# Patient Record
Sex: Female | Born: 1985 | Race: Black or African American | Hispanic: No | Marital: Single | Smoking: Never smoker
Health system: Southern US, Community
[De-identification: ages and names within clinical notes are randomized; demographics above are authoritative.]

## PROBLEM LIST (undated history)

## (undated) ENCOUNTER — Ambulatory Visit: Admission: EM | Payer: No Typology Code available for payment source

## (undated) DIAGNOSIS — B009 Herpesviral infection, unspecified: Secondary | ICD-10-CM

## (undated) DIAGNOSIS — R011 Cardiac murmur, unspecified: Secondary | ICD-10-CM

## (undated) HISTORY — DX: Cardiac murmur, unspecified: R01.1

## (undated) HISTORY — DX: Herpesviral infection, unspecified: B00.9

## (undated) HISTORY — PX: WISDOM TOOTH EXTRACTION: SHX21

---

## 2016-05-01 LAB — HM PAP SMEAR

## 2021-06-22 ENCOUNTER — Other Ambulatory Visit: Payer: Self-pay

## 2021-06-22 ENCOUNTER — Encounter: Payer: Self-pay | Admitting: Obstetrics and Gynecology

## 2021-06-22 ENCOUNTER — Ambulatory Visit (INDEPENDENT_AMBULATORY_CARE_PROVIDER_SITE_OTHER): Payer: BC Managed Care – PPO | Admitting: Obstetrics and Gynecology

## 2021-06-22 ENCOUNTER — Other Ambulatory Visit (HOSPITAL_COMMUNITY)
Admission: RE | Admit: 2021-06-22 | Discharge: 2021-06-22 | Disposition: A | Discharge status: Home / Self Care | Payer: BC Managed Care – PPO | Source: Ambulatory Visit | Attending: Obstetrics and Gynecology | Admitting: Obstetrics and Gynecology

## 2021-06-22 VITALS — BP 100/60 | Ht 67.0 in | Wt 170.0 lb

## 2021-06-22 DIAGNOSIS — Z124 Encounter for screening for malignant neoplasm of cervix: Secondary | ICD-10-CM | POA: Insufficient documentation

## 2021-06-22 DIAGNOSIS — Z01419 Encounter for gynecological examination (general) (routine) without abnormal findings: Secondary | ICD-10-CM | POA: Diagnosis not present

## 2021-06-22 DIAGNOSIS — Z1151 Encounter for screening for human papillomavirus (HPV): Secondary | ICD-10-CM | POA: Diagnosis not present

## 2021-06-22 DIAGNOSIS — Z30431 Encounter for routine checking of intrauterine contraceptive device: Secondary | ICD-10-CM | POA: Diagnosis not present

## 2021-06-22 NOTE — Patient Instructions (Signed)
I value your feedback and you entrusting us with your care. If you get a [facility] patient survey, I would appreciate you taking the time to let us know about your experience today. Thank you! ? ? ?

## 2021-06-22 NOTE — Progress Notes (Signed)
PCP:  Pcp, No   Chief Complaint  Patient presents with   Gynecologic Exam    Painful periods     HPI:      [name] is a 35 y.o. No obstetric history on file. whose LMP was No LMP recorded. (Menstrual status: IUD)., presents today for her NP > 3 yrs annual examination.  Her menses are occas with IUD, lasting 3 days, light flow. Some months just some brown d/c. Gets severe pain and nausea with menses, usually doesn't take anything but it is improved with ibup if she takes it. Hx of menometrorrhagia prior to IUD, so bleeding much improved. Pt never dx'd with endometriosis but pt wonders if she has it.   Sex activity: not sexually active. Mirena placed 06/19/16 Last Pap: 05/01/16 Results were: no abnormalities . No hx of abn paps. Hx of STDs: HSV  There is a FH of breast cancer in 2 pat aunts, genetic testing not indicated. There is no FH of ovarian cancer. The patient does do self-breast exams.  Tobacco use: The patient denies current or previous tobacco use. Alcohol use: none No drug use.  Exercise: moderately active  She does not get adequate calcium and Vitamin D in her diet.  Past Medical History:  Diagnosis Date   Heart murmur    HSV-2 infection     Past Surgical History:  Procedure Laterality Date   WISDOM TOOTH EXTRACTION      Family History  Problem Relation Age of Onset   Lung cancer Paternal Grandfather    Cancer - Colon Maternal Uncle    Breast cancer Paternal Aunt        68s   Breast cancer Paternal Aunt        67s    Social History   Socioeconomic History   Marital status: Single    Spouse name: Not on file   Number of children: Not on file   Years of education: Not on file   Highest education level: Not on file  Occupational History   Not on file  Tobacco Use   Smoking status: Never   Smokeless tobacco: Never  Vaping Use   Vaping Use: Never used  Substance and Sexual Activity   Alcohol use: Not Currently   Drug use: Not  Currently   Sexual activity: Not Currently    Birth control/protection: I.U.D.    Comment: Mirena  Other Topics Concern   Not on file  Social History Narrative   Not on file   Social Determinants of Health   Financial Resource Strain: Not on file  Food Insecurity: Not on file  Transportation Needs: Not on file  Physical Activity: Not on file  Stress: Not on file  Social Connections: Not on file  Intimate Partner Violence: Not on file     Current Outpatient Medications:    levonorgestrel (MIRENA) 20 MCG/DAY IUD, by Intrauterine route., Disp: , Rfl:      ROS:  Review of Systems  Constitutional:  Negative for fatigue, fever and unexpected weight change.  Respiratory:  Negative for cough, shortness of breath and wheezing.   Cardiovascular:  Negative for chest pain, palpitations and leg swelling.  Gastrointestinal:  Negative for blood in stool, constipation, diarrhea, nausea and vomiting.  Endocrine: Negative for cold intolerance, heat intolerance and polyuria.  Genitourinary:  Negative for dyspareunia, dysuria, flank pain, frequency, genital sores, hematuria, menstrual problem, pelvic pain, urgency, vaginal bleeding, vaginal discharge and vaginal pain.  Musculoskeletal:  Negative for back  pain, joint swelling and myalgias.  Skin:  Negative for rash.  Neurological:  Negative for dizziness, syncope, light-headedness, numbness and headaches.  Hematological:  Negative for adenopathy.  Psychiatric/Behavioral:  Negative for agitation, confusion, sleep disturbance and suicidal ideas. The patient is not nervous/anxious.   BREAST: No symptoms   Objective: BP 100/60   Ht 5\' 7"  (1.702 m)   Wt 170 lb (77.1 kg)   BMI 26.63 kg/m    Physical Exam Constitutional:      Appearance: She is well-developed.  Genitourinary:     Vulva normal.     Right Labia: No rash, tenderness or lesions.    Left Labia: No tenderness, lesions or rash.    No vaginal discharge, erythema or tenderness.       Right Adnexa: not tender and no mass present.    Left Adnexa: not tender and no mass present.    No cervical friability or polyp.     IUD strings visualized.     Uterus is not enlarged or tender.  Breasts:    Right: No mass, nipple discharge, skin change or tenderness.     Left: No mass, nipple discharge, skin change or tenderness.  Neck:     Thyroid: No thyromegaly.  Cardiovascular:     Rate and Rhythm: Normal rate and regular rhythm.     Heart sounds: Normal heart sounds. No murmur heard. Pulmonary:     Effort: Pulmonary effort is normal.     Breath sounds: Normal breath sounds.  Abdominal:     Palpations: Abdomen is soft.     Tenderness: There is no abdominal tenderness. There is no guarding or rebound.  Musculoskeletal:        General: Normal range of motion.     Cervical back: Normal range of motion.  Lymphadenopathy:     Cervical: No cervical adenopathy.  Neurological:     General: No focal deficit present.     Mental Status: She is alert and oriented to person, place, and time.     Cranial Nerves: No cranial nerve deficit.  Skin:    General: Skin is warm and dry.  Psychiatric:        Mood and Affect: Mood normal.        Behavior: Behavior normal.        Thought Content: Thought content normal.        Judgment: Judgment normal.  Vitals reviewed.    Assessment/Plan: Encounter for annual routine gynecological examination  Cervical cancer screening - Plan: Cytology - PAP  Screening for HPV (human papillomavirus) - Plan: Cytology - PAP  Encounter for routine checking of intrauterine contraceptive device (IUD)--IUD strings in cx os, due for removal 9/24            GYN counsel adequate intake of calcium and vitamin D, diet and exercise     F/U  Return in about 1 year (around 06/22/2022).  Cassandra Melton B. Lynn Sissel, PA-C 06/22/2021 2:02 PM

## 2021-06-29 LAB — CYTOLOGY - PAP
Comment: NEGATIVE
Diagnosis: NEGATIVE
High risk HPV: NEGATIVE

## 2022-01-17 ENCOUNTER — Emergency Department (HOSPITAL_BASED_OUTPATIENT_CLINIC_OR_DEPARTMENT_OTHER)
Admission: EM | Admit: 2022-01-17 | Discharge: 2022-01-18 | Disposition: A | Discharge status: Home / Self Care | Payer: No Typology Code available for payment source | Attending: Emergency Medicine | Admitting: Emergency Medicine

## 2022-01-17 ENCOUNTER — Emergency Department (HOSPITAL_BASED_OUTPATIENT_CLINIC_OR_DEPARTMENT_OTHER): Payer: No Typology Code available for payment source | Admitting: Radiology

## 2022-01-17 ENCOUNTER — Emergency Department (HOSPITAL_BASED_OUTPATIENT_CLINIC_OR_DEPARTMENT_OTHER): Payer: No Typology Code available for payment source

## 2022-01-17 ENCOUNTER — Encounter (HOSPITAL_BASED_OUTPATIENT_CLINIC_OR_DEPARTMENT_OTHER): Payer: Self-pay

## 2022-01-17 ENCOUNTER — Other Ambulatory Visit: Payer: Self-pay

## 2022-01-17 DIAGNOSIS — S6992XA Unspecified injury of left wrist, hand and finger(s), initial encounter: Secondary | ICD-10-CM | POA: Diagnosis present

## 2022-01-17 DIAGNOSIS — S60812A Abrasion of left wrist, initial encounter: Secondary | ICD-10-CM | POA: Diagnosis not present

## 2022-01-17 DIAGNOSIS — R519 Headache, unspecified: Secondary | ICD-10-CM | POA: Insufficient documentation

## 2022-01-17 DIAGNOSIS — M542 Cervicalgia: Secondary | ICD-10-CM | POA: Diagnosis not present

## 2022-01-17 DIAGNOSIS — R0789 Other chest pain: Secondary | ICD-10-CM | POA: Insufficient documentation

## 2022-01-17 DIAGNOSIS — Y92410 Unspecified street and highway as the place of occurrence of the external cause: Secondary | ICD-10-CM | POA: Insufficient documentation

## 2022-01-17 LAB — PREGNANCY, URINE: Preg Test, Ur: NEGATIVE

## 2022-01-17 NOTE — ED Notes (Signed)
Approached by triage RN who mentioned concern for neck tenderness in triage exam. C-collar applied. Shared concerns with EDP who provided verbal orders for head CT, cervical spine CT, chest xray. L wrist x ray added from triage standing orders for difficulty with ROM and swelling to L wrist. A&Ox4. PERRL. ?
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
## 2022-01-18 MED ORDER — METHOCARBAMOL 500 MG PO TABS
500.0000 mg | ORAL_TABLET | Freq: Once | ORAL | Status: AC
Start: 2022-01-18 — End: 2022-01-18
  Administered 2022-01-18: 500 mg via ORAL
  Filled 2022-01-18: qty 1

## 2022-01-18 MED ORDER — METHOCARBAMOL 500 MG PO TABS: 500.0000 mg | ORAL_TABLET | Freq: Two times a day (BID) | ORAL | 0 refills | Status: DC

## 2022-01-18 MED ORDER — IBUPROFEN 200 MG PO TABS
600.0000 mg | ORAL_TABLET | Freq: Once | ORAL | Status: AC
  Administered 2022-01-18: 600 mg via ORAL
  Filled 2022-01-18: qty 1

## 2022-01-18 MED ORDER — IBUPROFEN 600 MG PO TABS
600.0000 mg | ORAL_TABLET | Freq: Four times a day (QID) | ORAL | 0 refills | Status: DC | PRN
Start: 2022-01-18 — End: 2023-04-21
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
## 2022-01-18 NOTE — ED Notes (Signed)
EMT-P provided AVS using Teachback Method. Patient verbalizes understanding of Discharge Instructions. Opportunity for Questioning and Answers were provided by EMT-P. Patient Discharged from ED.  ? ?
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
## 2023-02-20 ENCOUNTER — Telehealth: Payer: Self-pay | Admitting: Obstetrics and Gynecology

## 2023-02-20 NOTE — Telephone Encounter (Signed)
Left message for patient to call office back to schedule annual with Alicia 

## 2023-03-24 NOTE — Telephone Encounter (Signed)
Pt is scheduled on 04/21/2023 with ABC.

## 2023-04-20 NOTE — Progress Notes (Signed)
PCP:  Pcp, No   Chief Complaint  Patient presents with  . Gynecologic Exam    No concerns     HPI:      [name] is a 37 y.o. No obstetric history on file. whose LMP was No LMP recorded. (Menstrual status: IUD)., presents today for her annual examination.  Her menses are monthly now with IUD, lasting 3 days, usually brown d/c; no BTB. Hx of severe pain and nausea with menses in past that has lessened this yr. Doesn't have to take ibup now.    Sex activity: currently sexually active--contraception-- Mirena placed 06/19/16. No pain/bleeding. Last Pap: 06/22/21 Results were: no abnormalities /neg HPV DNA. No hx of abn paps. Hx of STDs: HSV  There is a FH of breast cancer in 2 pat aunts but now maybe a 3rd, genetic testing not done. There is no FH of ovarian cancer. The patient does do self-breast exams.  Tobacco use: The patient denies current or previous tobacco use. Alcohol use: none No drug use.  Exercise: min active  She does not get adequate calcium and Vitamin D in her diet.  Past Medical History:  Diagnosis Date  . Heart murmur   . HSV-2 infection     Past Surgical History:  Procedure Laterality Date  . WISDOM TOOTH EXTRACTION      Family History  Problem Relation Age of Onset  . Lung cancer Paternal Grandfather   . Cancer - Colon Maternal Uncle   . Breast cancer Paternal Aunt        77s  . Breast cancer Paternal Aunt        84s    Social History   Socioeconomic History  . Marital status: Single    Spouse name: Not on file  . Number of children: Not on file  . Years of education: Not on file  . Highest education level: Not on file  Occupational History  . Not on file  Tobacco Use  . Smoking status: Never  . Smokeless tobacco: Never  Vaping Use  . Vaping Use: Never used  Substance and Sexual Activity  . Alcohol use: Not Currently  . Drug use: Not Currently  . Sexual activity: Yes    Birth control/protection: I.U.D.    Comment: Mirena   Other Topics Concern  . Not on file  Social History Narrative  . Not on file   Social Determinants of Health   Financial Resource Strain: Not on file  Food Insecurity: Not on file  Transportation Needs: Not on file  Physical Activity: Not on file  Stress: Not on file  Social Connections: Not on file  Intimate Partner Violence: Not on file     Current Outpatient Medications:  .  levonorgestrel (MIRENA) 20 MCG/DAY IUD, by Intrauterine route., Disp: , Rfl:      ROS:  Review of Systems  Constitutional:  Negative for fatigue, fever and unexpected weight change.  Respiratory:  Negative for cough, shortness of breath and wheezing.   Cardiovascular:  Negative for chest pain, palpitations and leg swelling.  Gastrointestinal:  Negative for blood in stool, constipation, diarrhea, nausea and vomiting.  Endocrine: Negative for cold intolerance, heat intolerance and polyuria.  Genitourinary:  Negative for dyspareunia, dysuria, flank pain, frequency, genital sores, hematuria, menstrual problem, pelvic pain, urgency, vaginal bleeding, vaginal discharge and vaginal pain.  Musculoskeletal:  Negative for back pain, joint swelling and myalgias.  Skin:  Negative for rash.  Neurological:  Negative for dizziness, syncope, light-headedness,  numbness and headaches.  Hematological:  Negative for adenopathy.  Psychiatric/Behavioral:  Negative for agitation, confusion, sleep disturbance and suicidal ideas. The patient is not nervous/anxious.    BREAST: No symptoms   Objective: BP 102/70   Ht 5\' 6"  (1.676 m)   Wt 187 lb (84.8 kg)   BMI 30.18 kg/m    Physical Exam Constitutional:      Appearance: She is well-developed.  Genitourinary:     Vulva normal.     Right Labia: No rash, tenderness or lesions.    Left Labia: No tenderness, lesions or rash.    No vaginal discharge, erythema or tenderness.      Right Adnexa: not tender and no mass present.    Left Adnexa: not tender and no mass  present.    No cervical friability or polyp.     IUD strings visualized.     Uterus is not enlarged or tender.  Breasts:    Right: No mass, nipple discharge, skin change or tenderness.     Left: No mass, nipple discharge, skin change or tenderness.  Neck:     Thyroid: No thyromegaly.  Cardiovascular:     Rate and Rhythm: Normal rate and regular rhythm.     Heart sounds: Normal heart sounds. No murmur heard. Pulmonary:     Effort: Pulmonary effort is normal.     Breath sounds: Normal breath sounds.  Abdominal:     Palpations: Abdomen is soft.     Tenderness: There is no abdominal tenderness. There is no guarding or rebound.  Musculoskeletal:        General: Normal range of motion.     Cervical back: Normal range of motion.  Lymphadenopathy:     Cervical: No cervical adenopathy.  Neurological:     General: No focal deficit present.     Mental Status: She is alert and oriented to person, place, and time.     Cranial Nerves: No cranial nerve deficit.  Skin:    General: Skin is warm and dry.  Psychiatric:        Mood and Affect: Mood normal.        Behavior: Behavior normal.        Thought Content: Thought content normal.        Judgment: Judgment normal.  Vitals reviewed.    Assessment/Plan: Encounter for annual routine gynecological examination  Encounter for routine checking of intrauterine contraceptive device (IUD); IUD strings in cx os. Has 8 yr indication; due for removal 9/25. Will discuss at annual next yr  Family history of breast cancer--pt to confirm if 3rd pat aunt with breast cancer. If so, pt qualifies for cancer genetic testing, handout given. Or affected aunt could do testing. Pt to f/u prn.             GYN counsel adequate intake of calcium and vitamin D, diet and exercise     F/U  Return in about 1 year (around 04/20/2024).  Aleaya Latona B. Denissa Cozart, PA-C 04/21/2023 1:24 PM

## 2023-04-21 ENCOUNTER — Ambulatory Visit: Payer: BC Managed Care – PPO | Admitting: Obstetrics and Gynecology

## 2023-04-21 ENCOUNTER — Encounter: Payer: Self-pay | Admitting: Obstetrics and Gynecology

## 2023-04-21 VITALS — BP 102/70 | Ht 66.0 in | Wt 187.0 lb

## 2023-04-21 DIAGNOSIS — Z30431 Encounter for routine checking of intrauterine contraceptive device: Secondary | ICD-10-CM

## 2023-04-21 DIAGNOSIS — Z01419 Encounter for gynecological examination (general) (routine) without abnormal findings: Secondary | ICD-10-CM

## 2023-04-21 DIAGNOSIS — Z803 Family history of malignant neoplasm of breast: Secondary | ICD-10-CM

## 2023-04-21 NOTE — Patient Instructions (Signed)
I value your feedback and you entrusting us with your care. If you get a [facility] patient survey, I would appreciate you taking the time to let us know about your experience today. Thank you! ? ? ?

## 2023-07-07 ENCOUNTER — Emergency Department (HOSPITAL_COMMUNITY)
Admission: EM | Admit: 2023-07-07 | Discharge: 2023-07-07 | Disposition: A | Discharge status: Home / Self Care | Payer: BC Managed Care – PPO | Attending: Emergency Medicine | Admitting: Emergency Medicine

## 2023-07-07 ENCOUNTER — Emergency Department (HOSPITAL_COMMUNITY): Payer: BC Managed Care – PPO

## 2023-07-07 DIAGNOSIS — S92355A Nondisplaced fracture of fifth metatarsal bone, left foot, initial encounter for closed fracture: Secondary | ICD-10-CM | POA: Insufficient documentation

## 2023-07-07 DIAGNOSIS — S99922A Unspecified injury of left foot, initial encounter: Secondary | ICD-10-CM | POA: Diagnosis not present

## 2023-07-07 DIAGNOSIS — W228XXA Striking against or struck by other objects, initial encounter: Secondary | ICD-10-CM | POA: Diagnosis not present

## 2023-07-07 DIAGNOSIS — S92511A Displaced fracture of proximal phalanx of right lesser toe(s), initial encounter for closed fracture: Secondary | ICD-10-CM | POA: Diagnosis not present

## 2023-07-07 MED ORDER — OXYCODONE HCL 5 MG PO TABS: 5.0000 mg | ORAL_TABLET | Freq: Four times a day (QID) | ORAL | 0 refills | Status: AC | PRN

## 2023-07-07 MED ORDER — IBUPROFEN 800 MG PO TABS
800.0000 mg | ORAL_TABLET | Freq: Once | ORAL | Status: AC
  Administered 2023-07-07: 800 mg via ORAL
  Filled 2023-07-07: qty 1

## 2023-07-07 NOTE — ED Triage Notes (Signed)
Pt hit a corner with right pinky toe this morning. Slight deformity.

## 2023-07-07 NOTE — Discharge Instructions (Addendum)
Please wear the postop shoe provided when walking.  You may take it off when sitting or sleeping.  Please keep your pinky toe buddy taped as shown here.  Rotate ibuprofen and tylenol to help provide full pain coverage. Take 1000mg  tylenol at 11:30am today, then 3 hours later you may take 600mg  ibuprofen, then 3 hours later another 1000 mg Tylenol, then 3 hours later 600 mg ibuprofen, swelling and so forth as needed for pain. These are over the counter medications you may get any drugstore.  You have been prescribed Oxycodone-this is a narcotic/controlled substance medication that has potential addicting qualities.  You may take 1 tablet every 6 hours as needed for severe pain not controlled by tylenol and ibuprofen. Do not drive or operate heavy machinery when taking this medicine as it can be sedating. Do not drink alcohol or take other sedating medications when taking this medicine for safety reasons.  Keep this out of reach of small children.    Follow-up with the orthopedics office listed below in 1 week for follow-up.  Return to the ER if your pain is uncontrolled by your pain medications, you develop numbness in your toe, any other new or concerning symptoms.

## 2023-07-07 NOTE — ED Provider Notes (Signed)
[place] [facility] AT [facility] Provider Note   CSN: [id number] Arrival date & time: 07/07/23  4098     History  Chief Complaint  Patient presents with   Foot Injury    Cassandra Melton is a 37 y.o. female who presents to the ER after hitting her right foot on a wall corner earlier today.  Had immediate pain of her right pinky toe.  Denies any numbness or tingling to the toe.   Foot Injury      Home Medications Prior to Admission medications   Medication Sig Start Date End Date Taking? Authorizing Provider  oxyCODONE (ROXICODONE) 5 MG immediate release tablet Take 1 tablet (5 mg total) by mouth every 6 (six) hours as needed for up to 3 days for severe pain or breakthrough pain (Pain not controlled with Tylenol and ibuprofen). 07/07/23 07/10/23 Yes Cassandra Merles, PA-C  levonorgestrel (MIRENA) 20 MCG/DAY IUD by Intrauterine route. 06/19/16   [provider]      Allergies    Amoxicillin, Mushroom extract complex, Flagyl [metronidazole], and Shellfish allergy    Review of Systems   Review of Systems  Musculoskeletal:        Right pinky toe pain    Physical Exam Updated Vital Signs BP 112/81 (BP Location: Left Arm)   Pulse 80   Temp 98.5 F (36.9 C) (Oral)   Resp 16   Ht 5' 6.5" (1.689 m)   Wt 81.6 kg   SpO2 100%   BMI 28.62 kg/m  Physical Exam Vitals and nursing note reviewed.  Constitutional:      Appearance: Normal appearance.  HENT:     Head: Atraumatic.  Cardiovascular:     Comments: Dorsalis pedis pulse 2+ bilaterally Pulmonary:     Effort: Pulmonary effort is normal.  Musculoskeletal:     Comments: Right lower extremity: Mild edema of the right pinky toe.  Pinky toe slightly angled laterally. Tender to palpation over the right fifth distal metatarsal.  No tenderness to palpation of the base of the fifth metatarsal. Able to flex and extend at the 1st through 5th toes, sensation intact in the 1st through 5th toes   Neurological:     General: No focal deficit present.     Mental Status: She is alert.  Psychiatric:        Mood and Affect: Mood normal.        Behavior: Behavior normal.     ED Results / Procedures / Treatments   Labs (all labs ordered are listed, but only abnormal results are displayed) Labs Reviewed - No data to display  EKG None  Radiology DG Foot Complete Right  Result Date: 07/07/2023 CLINICAL DATA:  Stubbed injury right fifth toe. Evaluate for fractures. EXAM: RIGHT FOOT COMPLETE - 3+ VIEW COMPARISON:  None Available. FINDINGS: There is an acute oblique distal diametaphyseal fracture of the small toe proximal phalanx, with the distal fragment angled mildly laterally and with mild soft tissue swelling. There is no further evidence of fractures. Arthritic changes are not seen. Additional mild swelling in the forefoot over the metatarsals. IMPRESSION: Acute oblique distal diametaphyseal fracture of the small toe proximal phalanx, with the distal fragment angled mildly laterally. Soft tissue swelling. Electronically Signed   By: Almira Bar M.D.   On: 07/07/2023 08:05    Procedures Procedures    Medications Ordered in ED Medications  ibuprofen (ADVIL) tablet 800 mg (800 mg Oral Given 07/07/23 1191)    ED Course/ Medical Decision  Making/ A&P                                 Medical Decision Making Amount and/or Complexity of Data Reviewed Radiology: ordered.   37 y.o. female presents the ED with concern for right pinky toe pain after hitting it in a wall  Differential diagnosis includes but is not limited to fracture, dislocation, soft tissue injury, contusion  ED Course:  Patient presented after hitting her right pinky toe on a wall earlier today.  The right pinky toe is slightly angulated laterally, however, sensation and range of motion intact of the right 5th toe. Is found to have an oblique fracture of the distal left fifth metatarsal.  It is not acutely  displaced, however it is mildly angulated laterally.  The toe was buddy taped to the right fourth toe.  She was placed in a postop shoe.  Patient given 800 mg ibuprofen for pain here today.  Was instructed on rotating ibuprofen and Tylenol for pain.  Prescribed a short 3-day course of oxycodone for breakthrough pain.   Impression: Right fifth distal metatarsal fracture, oblique, nondisplaced  Disposition:  The patient was discharged home with instructions to take Tylenol and ibuprofen for pain, oxycodone for breakthrough pain.  Follow-up with orthopedics in 1 week Return precautions given.    Imaging Studies ordered: I ordered imaging studies including x-ray right foot I independently visualized the imaging with scope of interpretation limited to determining acute life threatening conditions related to emergency care. Imaging showed distal right fifth metatarsal fracture I agree with the radiologist interpretation             Final Clinical Impression(s) / ED Diagnoses Final diagnoses:  Closed nondisplaced fracture of fifth metatarsal bone of left foot, initial encounter    Rx / DC Orders ED Discharge Orders          Ordered    oxyCODONE (ROXICODONE) 5 MG immediate release tablet  Every 6 hours PRN        07/07/23 0845              Cassandra Merles, PA-C 07/07/23 0857    Anders Simmonds T, DO 07/08/23 [phone]

## 2023-07-11 DIAGNOSIS — S92511A Displaced fracture of proximal phalanx of right lesser toe(s), initial encounter for closed fracture: Secondary | ICD-10-CM | POA: Diagnosis not present

## 2023-07-14 ENCOUNTER — Ambulatory Visit: Payer: BC Managed Care – PPO | Admitting: Orthopedic Surgery

## 2023-08-22 DIAGNOSIS — S92511A Displaced fracture of proximal phalanx of right lesser toe(s), initial encounter for closed fracture: Secondary | ICD-10-CM | POA: Diagnosis not present

## 2023-08-22 DIAGNOSIS — M79671 Pain in right foot: Secondary | ICD-10-CM | POA: Diagnosis not present

## 2023-11-11 DIAGNOSIS — S92511A Displaced fracture of proximal phalanx of right lesser toe(s), initial encounter for closed fracture: Secondary | ICD-10-CM | POA: Diagnosis not present
